# Patient Record
Sex: Male | Born: 1980 | Race: White | Hispanic: No | Smoking: Current every day smoker
Health system: Southern US, Community
[De-identification: ages and names within clinical notes are randomized; demographics above are authoritative.]

## PROBLEM LIST (undated history)

## (undated) DIAGNOSIS — I10 Essential (primary) hypertension: Secondary | ICD-10-CM

---

## 2011-10-20 ENCOUNTER — Emergency Department (HOSPITAL_COMMUNITY): Payer: PRIVATE HEALTH INSURANCE

## 2011-10-20 ENCOUNTER — Encounter (HOSPITAL_COMMUNITY): Payer: Self-pay | Admitting: Family Medicine

## 2011-10-20 ENCOUNTER — Emergency Department (HOSPITAL_COMMUNITY)
Admission: EM | Admit: 2011-10-20 | Discharge: 2011-10-20 | Disposition: A | Discharge status: Home / Self Care | Payer: PRIVATE HEALTH INSURANCE | Attending: Emergency Medicine | Admitting: Emergency Medicine

## 2011-10-20 DIAGNOSIS — H5789 Other specified disorders of eye and adnexa: Secondary | ICD-10-CM | POA: Insufficient documentation

## 2011-10-20 DIAGNOSIS — S0003XA Contusion of scalp, initial encounter: Secondary | ICD-10-CM | POA: Insufficient documentation

## 2011-10-20 DIAGNOSIS — R0602 Shortness of breath: Secondary | ICD-10-CM | POA: Insufficient documentation

## 2011-10-20 DIAGNOSIS — Z79899 Other long term (current) drug therapy: Secondary | ICD-10-CM | POA: Insufficient documentation

## 2011-10-20 DIAGNOSIS — S1093XA Contusion of unspecified part of neck, initial encounter: Secondary | ICD-10-CM | POA: Insufficient documentation

## 2011-10-20 DIAGNOSIS — R079 Chest pain, unspecified: Secondary | ICD-10-CM | POA: Insufficient documentation

## 2011-10-20 DIAGNOSIS — S0083XA Contusion of other part of head, initial encounter: Secondary | ICD-10-CM

## 2011-10-20 DIAGNOSIS — R42 Dizziness and giddiness: Secondary | ICD-10-CM | POA: Insufficient documentation

## 2011-10-20 DIAGNOSIS — F29 Unspecified psychosis not due to a substance or known physiological condition: Secondary | ICD-10-CM | POA: Insufficient documentation

## 2011-10-20 DIAGNOSIS — R413 Other amnesia: Secondary | ICD-10-CM

## 2011-10-20 DIAGNOSIS — S20219A Contusion of unspecified front wall of thorax, initial encounter: Secondary | ICD-10-CM | POA: Insufficient documentation

## 2011-10-20 DIAGNOSIS — H9209 Otalgia, unspecified ear: Secondary | ICD-10-CM | POA: Insufficient documentation

## 2011-10-20 LAB — BASIC METABOLIC PANEL
BUN: 8 mg/dL (ref 6–23)
CO2: 29 mEq/L (ref 19–32)
Chloride: 101 mEq/L (ref 96–112)
Creatinine, Ser: 0.86 mg/dL (ref 0.50–1.35)
GFR calc Af Amer: >90 mL/min (ref >90)
Glucose, Bld: 96 mg/dL (ref 70–99)
Potassium: 3.7 mEq/L (ref 3.5–5.1)

## 2011-10-20 LAB — CBC
HCT: 42.5 % (ref 39.0–52.0)
Hemoglobin: 15.5 g/dL (ref 13.0–17.0)
MCV: 88.2 fL (ref 78.0–100.0)
RBC: 4.82 MIL/uL (ref 4.22–5.81)
RDW: 12.6 % (ref 11.5–15.5)
WBC: 18.7 10*3/uL — ABNORMAL HIGH (ref 4.0–10.5)

## 2011-10-20 MED ORDER — IBUPROFEN 600 MG PO TABS: 600.0000 mg | ORAL_TABLET | Freq: Three times a day (TID) | ORAL | Status: AC | PRN

## 2011-10-20 MED ORDER — HYDROMORPHONE HCL PF 2 MG/ML IJ SOLN
2.0000 mg | Freq: Once | INTRAMUSCULAR | Status: AC
  Administered 2011-10-20: 2 mg via INTRAMUSCULAR
  Filled 2011-10-20: qty 1

## 2011-10-20 MED ORDER — HYDROMORPHONE HCL PF 2 MG/ML IJ SOLN: 2.0000 mg | Freq: Once | INTRAMUSCULAR | Status: DC

## 2011-10-20 MED ORDER — OXYCODONE-ACETAMINOPHEN 5-325 MG PO TABS: 1.0000 | ORAL_TABLET | ORAL | Status: AC | PRN
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
## 2018-06-06 ENCOUNTER — Encounter (HOSPITAL_COMMUNITY): Payer: Self-pay

## 2018-06-06 ENCOUNTER — Emergency Department (HOSPITAL_COMMUNITY): Payer: Managed Care, Other (non HMO)

## 2018-06-06 ENCOUNTER — Emergency Department (HOSPITAL_COMMUNITY)
Admission: EM | Admit: 2018-06-06 | Discharge: 2018-06-06 | Disposition: A | Discharge status: Home / Self Care | Payer: Managed Care, Other (non HMO) | Attending: Emergency Medicine | Admitting: Emergency Medicine

## 2018-06-06 DIAGNOSIS — Y929 Unspecified place or not applicable: Secondary | ICD-10-CM | POA: Insufficient documentation

## 2018-06-06 DIAGNOSIS — W01198A Fall on same level from slipping, tripping and stumbling with subsequent striking against other object, initial encounter: Secondary | ICD-10-CM | POA: Diagnosis not present

## 2018-06-06 DIAGNOSIS — Y999 Unspecified external cause status: Secondary | ICD-10-CM | POA: Diagnosis not present

## 2018-06-06 DIAGNOSIS — Z7982 Long term (current) use of aspirin: Secondary | ICD-10-CM | POA: Insufficient documentation

## 2018-06-06 DIAGNOSIS — F17210 Nicotine dependence, cigarettes, uncomplicated: Secondary | ICD-10-CM | POA: Diagnosis not present

## 2018-06-06 DIAGNOSIS — S6991XA Unspecified injury of right wrist, hand and finger(s), initial encounter: Secondary | ICD-10-CM | POA: Diagnosis present

## 2018-06-06 DIAGNOSIS — Y939 Activity, unspecified: Secondary | ICD-10-CM | POA: Insufficient documentation

## 2018-06-06 DIAGNOSIS — I10 Essential (primary) hypertension: Secondary | ICD-10-CM | POA: Diagnosis not present

## 2018-06-06 DIAGNOSIS — Z79899 Other long term (current) drug therapy: Secondary | ICD-10-CM | POA: Diagnosis not present

## 2018-06-06 DIAGNOSIS — S60221A Contusion of right hand, initial encounter: Secondary | ICD-10-CM | POA: Insufficient documentation

## 2018-06-06 HISTORY — DX: Essential (primary) hypertension: I10

## 2018-06-06 NOTE — Discharge Instructions (Signed)
Ice and elevate the hand to reduce pain and swelling Ibuprofen - take with food. Take up to 3-4 times daily Return if worsening
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
## 2020-03-27 IMAGING — DX DG HAND COMPLETE 3+V*R*
1 series · 3 of 3 positions shown · non-contrast
Comparison: None.

CLINICAL DATA: Medial and posterior hand pain.

EXAM:
RIGHT HAND - COMPLETE 3+ VIEW

[Series 1: hand · 0.14mm/px · 3 of 3 slices shown]
[im 1/3]
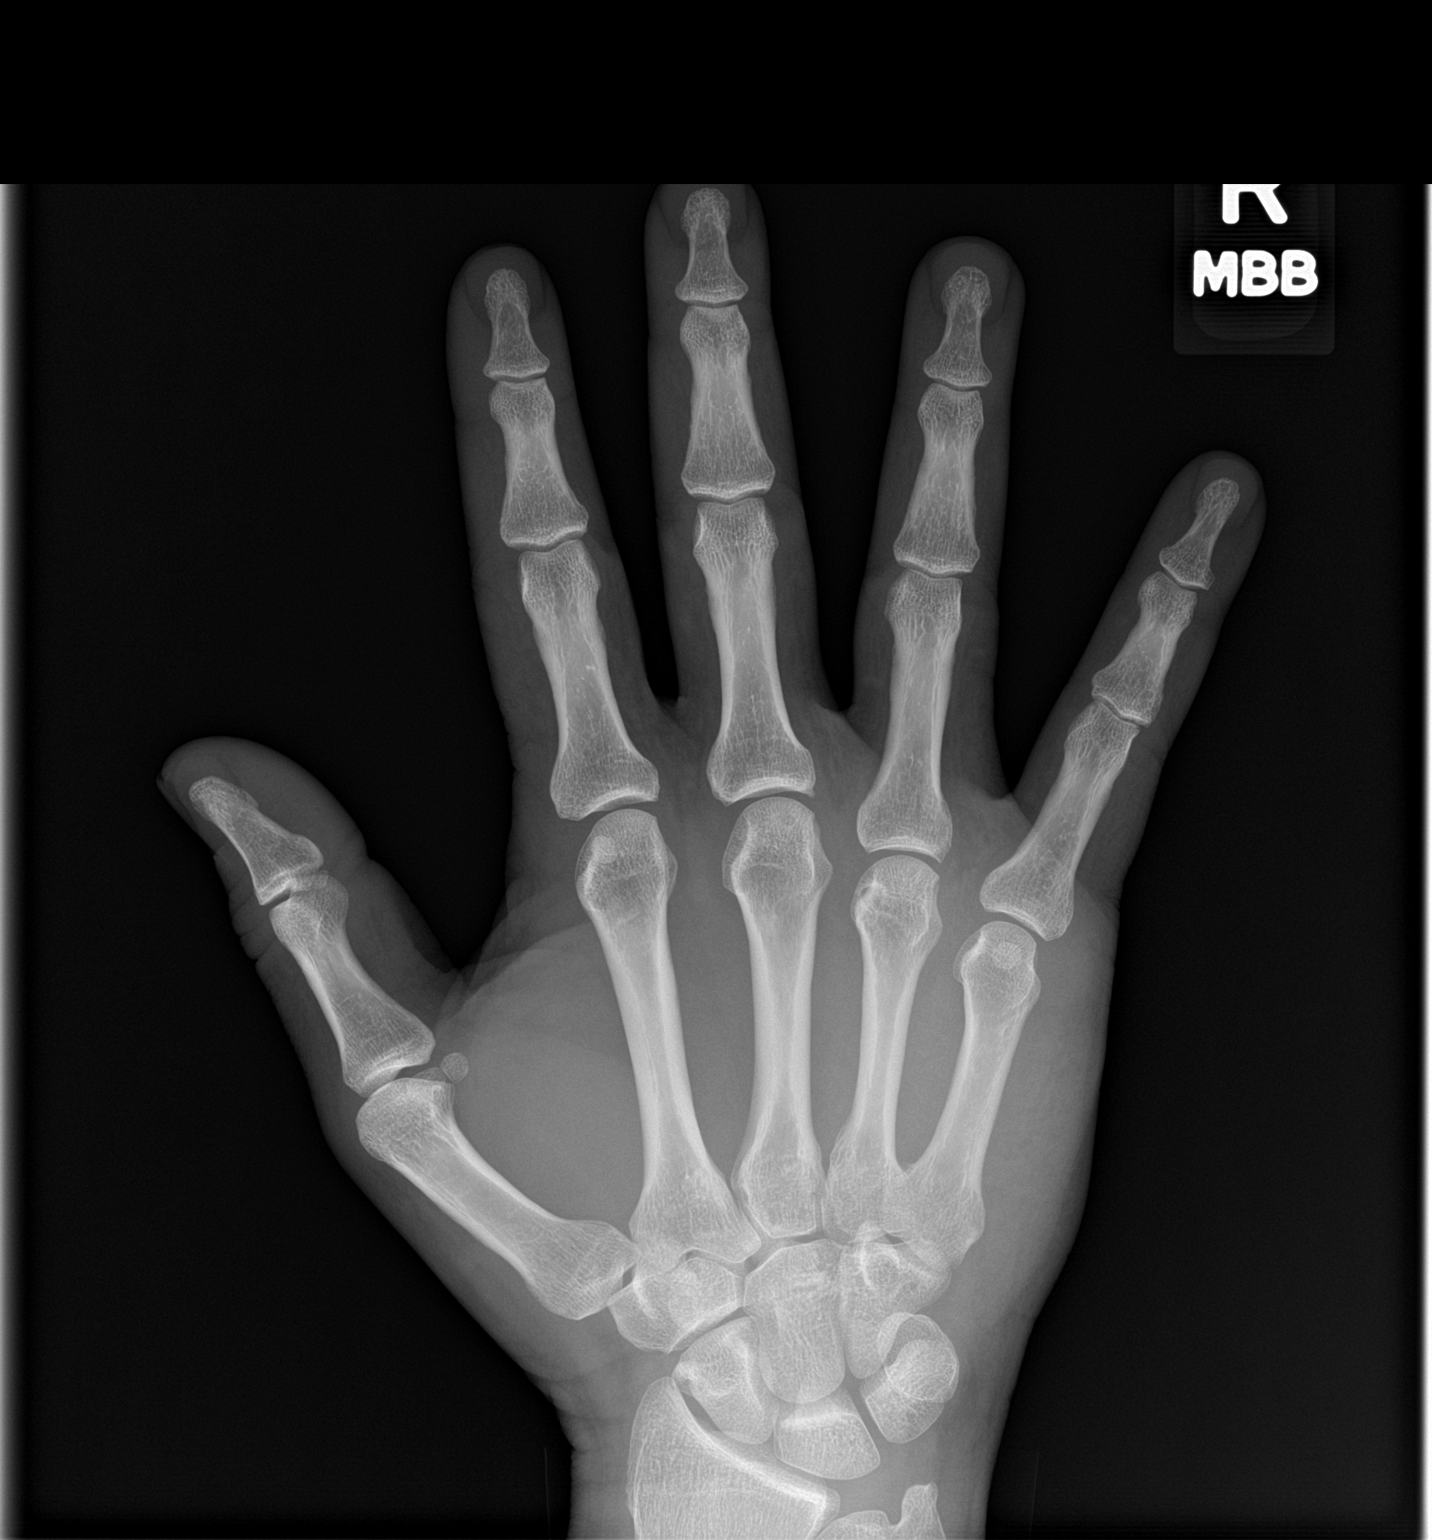
[im 2/3]
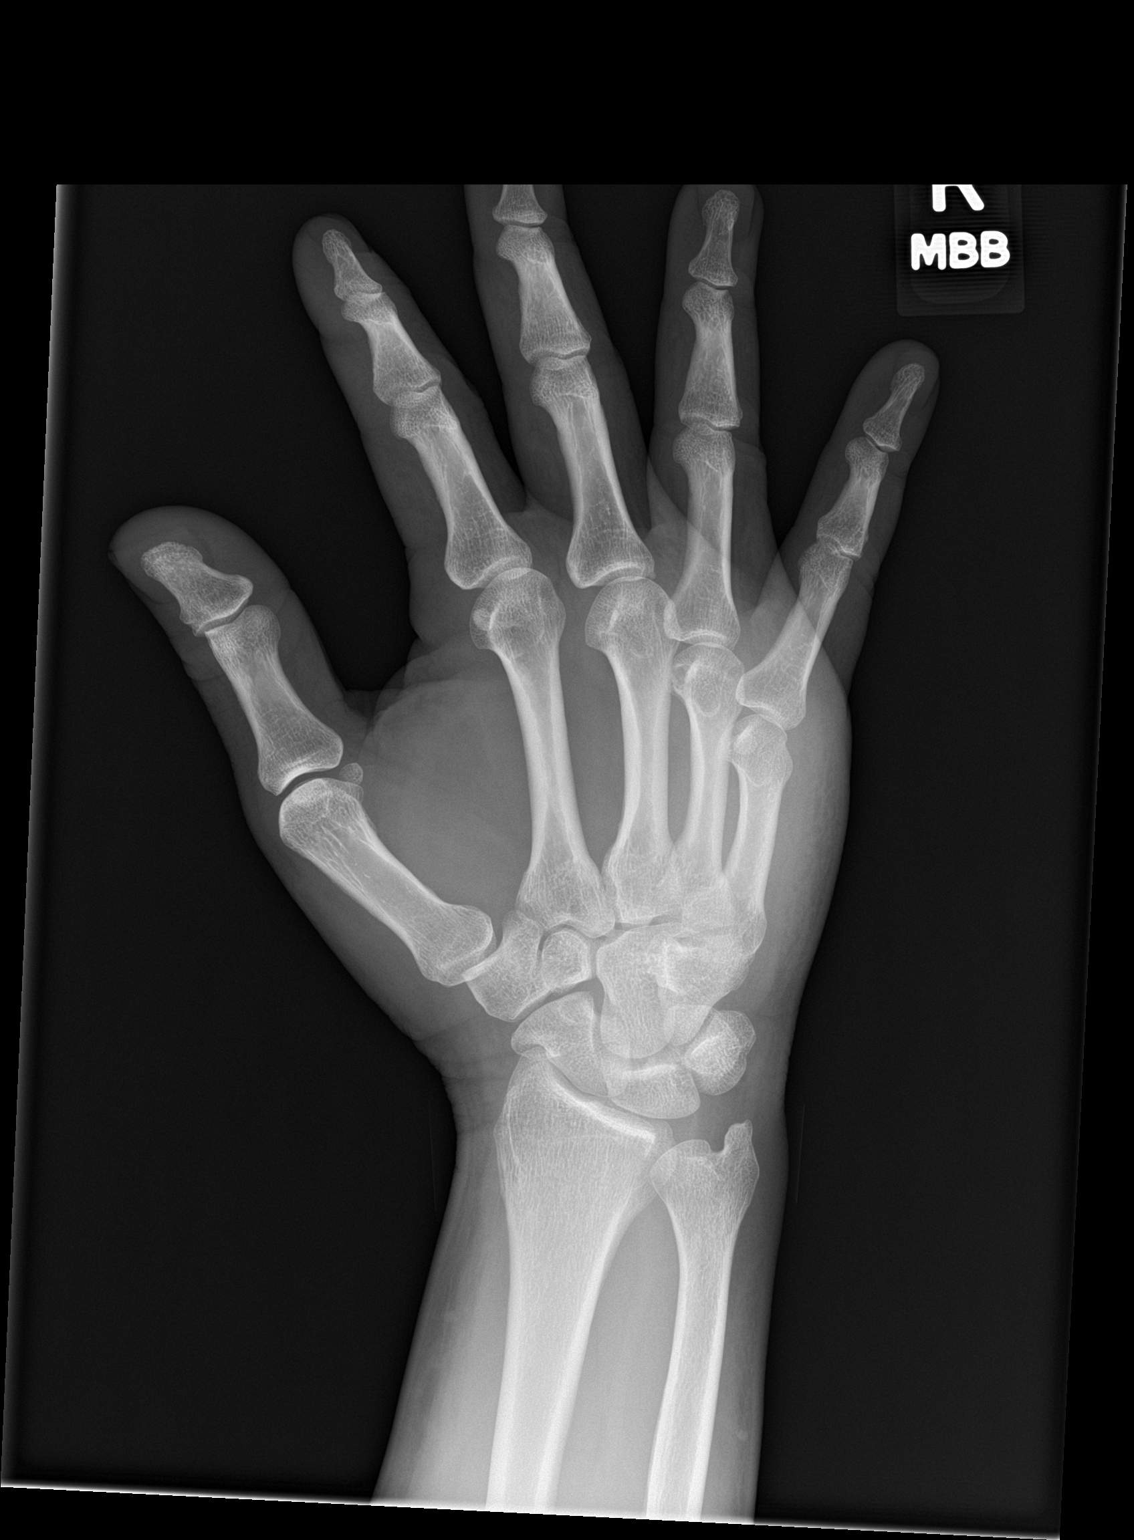
[im 3/3]
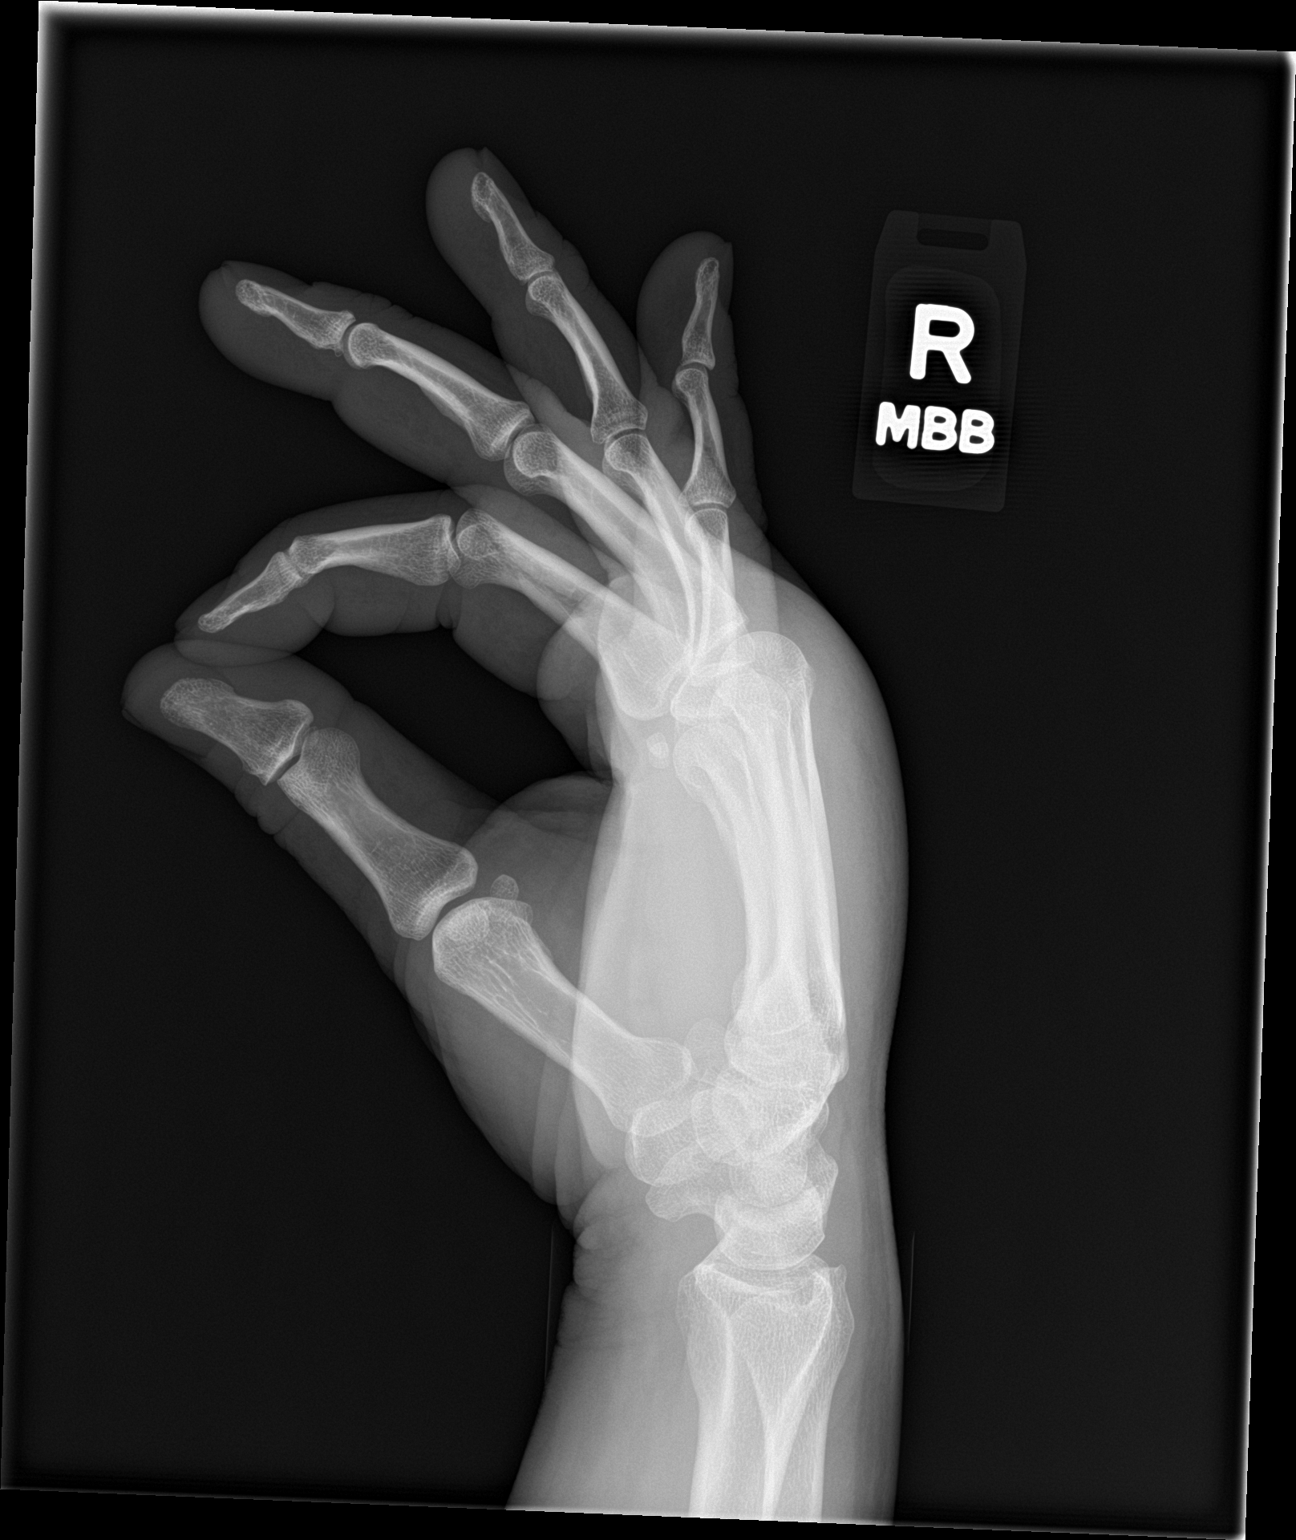

[3 of 3 positions shown; findings below may reference images not displayed]

FINDINGS: There is no evidence of fracture or dislocation. There is no
evidence of arthropathy or other focal bone abnormality. Posterior
soft tissue swelling evident.
IMPRESSION: Negative.
